# Patient Record
Sex: Female | Born: 2005 | Race: Black or African American | Hispanic: No | Marital: Single | Smoking: Never smoker
Health system: Southern US, Community
[De-identification: ages and names within clinical notes are randomized; demographics above are authoritative.]

## PROBLEM LIST (undated history)

## (undated) DIAGNOSIS — J45909 Unspecified asthma, uncomplicated: Secondary | ICD-10-CM

## (undated) DIAGNOSIS — R51 Headache: Secondary | ICD-10-CM

## (undated) DIAGNOSIS — T7840XA Allergy, unspecified, initial encounter: Secondary | ICD-10-CM

## (undated) HISTORY — PX: TONSILLECTOMY AND ADENOIDECTOMY: SUR1326

## (undated) HISTORY — DX: Headache: R51

## (undated) HISTORY — DX: Unspecified asthma, uncomplicated: J45.909

## (undated) HISTORY — DX: Allergy, unspecified, initial encounter: T78.40XA

---
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
## 2024-06-19 ENCOUNTER — Ambulatory Visit: Admitting: Family Medicine

## 2024-06-19 ENCOUNTER — Encounter: Payer: Self-pay | Admitting: Family Medicine

## 2024-06-19 VITALS — BP 150/96 | HR 86 | Temp 97.4°F | Ht 68.3 in | Wt 330.0 lb

## 2024-06-19 DIAGNOSIS — R5383 Other fatigue: Secondary | ICD-10-CM

## 2024-06-19 DIAGNOSIS — G43009 Migraine without aura, not intractable, without status migrainosus: Secondary | ICD-10-CM

## 2024-06-19 DIAGNOSIS — N921 Excessive and frequent menstruation with irregular cycle: Secondary | ICD-10-CM

## 2024-06-19 DIAGNOSIS — R030 Elevated blood-pressure reading, without diagnosis of hypertension: Secondary | ICD-10-CM

## 2024-06-19 LAB — COMPREHENSIVE METABOLIC PANEL WITH GFR
ALT: 9 U/L (ref 0–35)
AST: 14 U/L (ref 0–37)
Albumin: 4.6 g/dL (ref 3.5–5.2)
Alkaline Phosphatase: 63 U/L (ref 47–119)
BUN: 11 mg/dL (ref 6–23)
CO2: 27 meq/L (ref 19–32)
Calcium: 9.3 mg/dL (ref 8.4–10.5)
Chloride: 104 meq/L (ref 96–112)
Creatinine, Ser: 0.77 mg/dL (ref 0.40–1.20)
GFR: 111.79 mL/min (ref >60.00)
Glucose, Bld: 101 mg/dL — ABNORMAL HIGH (ref 70–99)
Potassium: 3.9 meq/L (ref 3.5–5.1)
Sodium: 139 meq/L (ref 135–145)
Total Bilirubin: 0.3 mg/dL (ref 0.2–1.2)
Total Protein: 7.5 g/dL (ref 6.0–8.3)

## 2024-06-19 LAB — CBC WITH DIFFERENTIAL/PLATELET
Basophils Absolute: 0 K/uL (ref 0.0–0.1)
Basophils Relative: 0.4 % (ref 0.0–3.0)
Eosinophils Absolute: 0.1 K/uL (ref 0.0–0.7)
Eosinophils Relative: 1 % (ref 0.0–5.0)
HCT: 36.8 % (ref 36.0–49.0)
Hemoglobin: 11.9 g/dL — ABNORMAL LOW (ref 12.0–16.0)
Lymphocytes Relative: 16.8 % — ABNORMAL LOW (ref 24.0–48.0)
Lymphs Abs: 1.5 K/uL (ref 0.7–4.0)
MCHC: 32.3 g/dL (ref 31.0–37.0)
MCV: 82.1 fl (ref 78.0–98.0)
Monocytes Absolute: 0.4 K/uL (ref 0.1–1.0)
Monocytes Relative: 4.8 % (ref 3.0–12.0)
Neutro Abs: 6.7 K/uL (ref 1.4–7.7)
Neutrophils Relative %: 77 % — ABNORMAL HIGH (ref 43.0–71.0)
Platelets: 266 K/uL (ref 150.0–575.0)
RBC: 4.48 Mil/uL (ref 3.80–5.70)
RDW: 15.7 % — ABNORMAL HIGH (ref 11.4–15.5)
WBC: 8.7 K/uL (ref 4.5–13.5)

## 2024-06-19 LAB — IBC + FERRITIN
Ferritin: 6.3 ng/mL — ABNORMAL LOW (ref 10.0–291.0)
Iron: 25 ug/dL — ABNORMAL LOW (ref 42–145)
Saturation Ratios: 5.4 % — ABNORMAL LOW (ref 20.0–50.0)
TIBC: 460.6 ug/dL — ABNORMAL HIGH (ref 250.0–450.0)
Transferrin: 329 mg/dL (ref 212.0–360.0)

## 2024-06-19 LAB — TSH: TSH: 0.8 u[IU]/mL (ref 0.40–5.00)

## 2024-06-19 LAB — VITAMIN D 25 HYDROXY (VIT D DEFICIENCY, FRACTURES): VITD: 8.31 ng/mL — ABNORMAL LOW (ref 30.00–100.00)

## 2024-06-19 LAB — HEMOGLOBIN A1C: Hgb A1c MFr Bld: 5.8 % (ref 4.6–6.5)

## 2024-06-19 LAB — VITAMIN B12: Vitamin B-12: 485 pg/mL (ref 211–911)

## 2024-06-19 NOTE — Patient Instructions (Signed)

## 2024-06-19 NOTE — Progress Notes (Signed)
 New Patient Office Visit  Subjective:  Patient ID: Jillian Figueroa, female    DOB: 05-25-2006  Age: 18 y.o. MRN: [id number]  CC:  Chief Complaint  Patient presents with   Establish Care    Pt is here to Establish Care    Discussed the use of AI scribe software for clinical note transcription with the patient, who gave verbal consent to proceed.  History of Present Illness Jillian Figueroa is a 18 year old female who presents for establishment of care.  She has a history of migraines that occur every few months, typically coinciding with her irregular menstrual periods. The migraines are not relieved by over-the-counter medications like Excedrin, and she has not tried any prescription medications. Despite the migraines, she continues with her daily activities, although she finds them bothersome.  Her asthma is well-controlled, and she carries an inhaler for emergencies. She has not had an asthma flare-up in four to five years and remains active, exercising regularly.  Her mother suggested she ask about possible anemia. She experiences fatigue, feeling 'druggie' despite adequate sleep, and craves ice. Her periods are heavy, requiring pad changes every two hours for three to four days, and she experiences clotting. No dizziness or lightheadedness. Her periods occur every two to three months, with the last one starting on June 19, 2024, and the one before that on May 15, 2024, but can go 3 mo w/o menses.  No runny nose, congestion, allergies, chest pains, heart racing, skipping beats, coughing, wheezing, shortness of breath, vomiting, diarrhea, constipation, urinary issues, muscle aches, joint pains, or depression. She does not smoke, vape, use drugs, or consume alcohol.    Current Medications[1]  Past Medical History:  Diagnosis Date   Allergy    Asthma    Headache(784.0)    Migraine headache without aura     Past Surgical History:  Procedure Laterality Date   TONSILLECTOMY  AND ADENOIDECTOMY      Family History  Problem Relation Age of Onset   Congestive Heart Failure Maternal Grandmother        Died at the age of 3   Diabetes Father    Hypertension Father     Social History   Socioeconomic History   Marital status: Single    Spouse name: Not on file   Number of children: 0   Years of education: Not on file   Highest education level: Not on file  Occupational History   Not on file  Tobacco Use   Smoking status: Never   Smokeless tobacco: Never  Vaping Use   Vaping status: Never Used  Substance and Sexual Activity   Alcohol use: Never   Drug use: Never   Sexual activity: Not on file  Other Topics Concern   Not on file  Social History Narrative      She lives with her mother.    Jillian Figueroa [employer]   Social Drivers of Health   Tobacco Use: Low Risk (06/19/2024)   Patient History    Smoking Tobacco Use: Never    Smokeless Tobacco Use: Never    Passive Exposure: Not on file  Financial Resource Strain: Not on file  Food Insecurity: Not on file  Transportation Needs: Not on file  Physical Activity: Not on file  Stress: Not on file  Social Connections: Not on file  Intimate Partner Violence: Not on file  Depression (EYV7-0): Not on file  Alcohol Screen: Not on file  Housing: Not on file  Utilities: Not on file  Health Literacy: Not on file    ROS  ROS: Gen: no fever, chills  Skin: no rash, itching ENT: no ear pain, ear drainage, nasal congestion, rhinorrhea, sinus pressure, sore throat Eyes: no blurry vision, double vision Resp: no cough, wheeze,SOB CV: no CP, palpitations, LE edema,  GI: no heartburn, n/v/d/c, abd pain GU: no dysuria, urgency, frequency, hematuria MSK: no joint pain, myalgias, back pain Neuro: no dizziness, weakness, vertigo Psych: no depression, anxiety, insomnia, SI   Objective:   Today's Vitals: BP (!) 150/96 (BP Location: Left Arm, Cuff Size: Large)   Pulse 86   Temp (!) 97.4 F (36.3 C)  (Temporal)   Ht 5' 8.3 (1.735 m)   Wt (!) 330 lb (149.7 kg)   LMP 06/16/2024 (Exact Date)   SpO2 98%   BMI 49.74 kg/m   Physical Exam  Gen: WDWN NAD HEENT: NCAT, conjunctiva not injected, sclera nonicteric  NECK:  supple, no thyromegaly, no nodes, no carotid bruits CARDIAC: RRR, S1S2+, no murmur. DP 2+B LUNGS: CTAB. No wheezes ABDOMEN:  BS+, soft, NTND, No HSM, no masses EXT:  no edema MSK: no gross abnormalities.  NEURO: A&O x3.  CN II-XII intact.  PSYCH: normal mood. Good eye contact   Assessment & Plan:  Migraine without aura and without status migrainosus, not intractable  Other fatigue -     CBC with Differential/Platelet -     Comprehensive metabolic panel with GFR -     Hemoglobin A1c -     TSH -     Vitamin B12 -     IBC + Ferritin -     VITAMIN D  25 Hydroxy (Vit-D Deficiency, Fractures)  Menorrhagia with irregular cycle -     CBC with Differential/Platelet -     Comprehensive metabolic panel with GFR -     Hemoglobin A1c -     TSH -     Vitamin B12 -     IBC + Ferritin  Elevated blood pressure reading    Assessment and Plan Assessment & Plan Menorrhagia with irregular cycle   She experiences irregular menstrual cycles with heavy bleeding every two hours on heavy days, lasting three to four days. There is a possibility of anemia due to the heavy bleeding. Differential diagnosis includes polycystic ovary syndrome (PCOS) and insulin resistance, considering her family history of diabetes and irregular periods. A blood test has been ordered to evaluate for anemia. If anemia is confirmed, starting birth control pills to manage the menstrual cycle will be considered.  Menstrual migraine   She experiences migraines with menstruation approximately every few months. These are not incapacitating and are not relieved by over-the-counter medications like Excedrin. No prescription medications have been tried, and she prefers to manage without medication as the migraines  are infrequent and manageable. Current management will continue without prescription medication.  Fatigue, evaluating for anemia   She reports persistent fatigue over the past two years despite adequate sleep, with no dizziness or lightheadedness. Possible anemia due to heavy menstrual bleeding is suspected. A blood test has been ordered to evaluate for anemia.  Elevated blood pressure, monitoring for hypertension   Her blood pressure was elevated during the visit, possibly due to anxiety, with no prior history of hypertension. She is advised to monitor her blood pressure at school to rule out persistent hypertension. Recommendations include weight loss of 10-15 pounds, continued physical activity, a healthy diet, and reducing salt intake.  Asthma   Her asthma is well-controlled with no recent  exacerbations in the past four to five years. An inhaler is available for use if needed. Current asthma management will continue.      Follow-up: Return in about 6 months (around 12/18/2024) for annual physical.   Jenkins CHRISTELLA Carrel, MD     [1]  Current Outpatient Medications:    beclomethasone (QVAR) 40 MCG/ACT inhaler, Use daily, Disp: , Rfl:    Fluticasone Propionate, Inhal, (FLOVENT IN), Inhale into the lungs., Disp: , Rfl:    loratadine (CLARITIN) 5 MG/5ML syrup, Take 5 ml daily for allergies, Disp: , Rfl:    MINOCYCLINE HCL PO, Take by mouth., Disp: , Rfl:    montelukast (SINGULAIR) 5 MG chewable tablet, Chew 1 tablet daily for allergies, Disp: , Rfl:    naproxen  (NAPROSYN ) 500 MG tablet, Take 1 tablet (500 mg total) by mouth 2 (two) times daily with a meal., Disp: 30 tablet, Rfl: 0   ofloxacin (OCUFLOX) 0.3 % ophthalmic solution, , Disp: , Rfl:

## 2024-06-22 ENCOUNTER — Ambulatory Visit: Payer: Self-pay | Admitting: Family Medicine

## 2024-06-22 NOTE — Progress Notes (Signed)
 Labs  A1C(3 month average of sugars) is elevated.  This is considered PreDiabetes.  Work on diet-decrease sugars and starches and aim for 30 minutes of exercise 5 days/week to prevent progression to diabetes  Vitamin D  very low-please send in vitamin d  50,000iu/week #12/1 Slight anemia but iron is very low-needs to take otc iron 325mg  daily. May need to take colace 200mg  daily and/or miralax as may get constipated.  She will need to check cbcd,iron studies in 2-3 months

## 2024-06-23 NOTE — Progress Notes (Signed)
 Pt has read results [facility]

## 2024-07-03 ENCOUNTER — Telehealth: Payer: Self-pay | Admitting: Family Medicine

## 2024-07-03 NOTE — Telephone Encounter (Signed)
 Copied from CRM #[id number]. Topic: Clinical - Prescription Issue >> Jul 03, 2024  4:31 PM Amy B wrote: Reason for CRM: Patient states she was told at her last visit that she would be prescribed vitamin d  50,000iu/week #12/1 as her Vitamin D  is very low.  She checked with her pharmacy and they have not received the prescription.  Please advise.

## 2024-07-04 ENCOUNTER — Other Ambulatory Visit: Payer: Self-pay

## 2024-07-04 MED ORDER — VITAMIN D (ERGOCALCIFEROL) 1.25 MG (50000 UNIT) PO CAPS: 50000.0000 [IU] | ORAL_CAPSULE | ORAL | 0 refills | Status: AC

## 2024-07-04 NOTE — Telephone Encounter (Signed)
 Verified per lab results Vitamin D  was requested by PCP and sent to pt pharmacy

## 2024-12-26 ENCOUNTER — Encounter: Admitting: Family Medicine
